# Patient Record
Sex: Male | Born: 1960 | Race: White | Hispanic: No | Marital: Single | Smoking: Current every day smoker
Health system: Southern US, Community
[De-identification: ages and names within clinical notes are randomized; demographics above are authoritative.]

## PROBLEM LIST (undated history)

## (undated) DIAGNOSIS — G709 Myoneural disorder, unspecified: Secondary | ICD-10-CM

## (undated) DIAGNOSIS — M1990 Unspecified osteoarthritis, unspecified site: Secondary | ICD-10-CM

## (undated) DIAGNOSIS — M797 Fibromyalgia: Secondary | ICD-10-CM

## (undated) HISTORY — PX: SHOULDER ARTHROSCOPY: SHX128

---

## 2014-11-20 ENCOUNTER — Other Ambulatory Visit (HOSPITAL_COMMUNITY): Payer: Self-pay | Admitting: *Deleted

## 2014-11-20 DIAGNOSIS — M542 Cervicalgia: Secondary | ICD-10-CM

## 2014-11-27 ENCOUNTER — Ambulatory Visit (HOSPITAL_COMMUNITY): Payer: Medicaid Other

## 2014-12-08 ENCOUNTER — Ambulatory Visit (HOSPITAL_COMMUNITY)
Admission: RE | Admit: 2014-12-08 | Discharge: 2014-12-08 | Disposition: A | Discharge status: Home / Self Care | Payer: Medicaid Other | Source: Ambulatory Visit | Attending: *Deleted | Admitting: *Deleted

## 2014-12-08 DIAGNOSIS — M47892 Other spondylosis, cervical region: Secondary | ICD-10-CM | POA: Insufficient documentation

## 2014-12-08 DIAGNOSIS — M2578 Osteophyte, vertebrae: Secondary | ICD-10-CM | POA: Diagnosis not present

## 2014-12-08 DIAGNOSIS — M542 Cervicalgia: Secondary | ICD-10-CM

## 2014-12-08 DIAGNOSIS — M25511 Pain in right shoulder: Secondary | ICD-10-CM | POA: Diagnosis not present

## 2014-12-08 DIAGNOSIS — M25512 Pain in left shoulder: Secondary | ICD-10-CM | POA: Insufficient documentation

## 2014-12-08 DIAGNOSIS — M4802 Spinal stenosis, cervical region: Secondary | ICD-10-CM | POA: Insufficient documentation

## 2015-03-13 ENCOUNTER — Other Ambulatory Visit: Payer: Self-pay | Admitting: Orthopaedic Surgery

## 2015-03-18 NOTE — Pre-Procedure Instructions (Signed)
   Pasco L Cerney 03/18/2015      [facility] - [place], [place] - [address] [place] [address] Phone: [phone] Fax: [phone]    Your procedure is scheduled on Tuesday March 31, 2015 at 12:30 PM.  [facility] Admitting at 10:30 A.M.  Call this number if you have problems the morning of surgery:  [phone]   Remember:  Do not eat food or drink liquids after midnight.  Take these medicines the morning of surgery with A SIP OF WATER gabapentin (Neurotin).   Do not wear jewelry, make-up or nail polish.  Do not wear lotions, powders, or perfumes.  You may wear deodorant.  Do not shave 48 hours prior to surgery.  Men may shave face and neck.  Do not bring valuables to the hospital.  [facility] not responsible [facility] any belongings or valuables.  Contacts, dentures or bridgework may not be worn into surgery.  Leave your suitcase in the car.  After surgery it may be brought to your room.  For patients admitted to the hospital, discharge time will be determined by your treatment team.  Patients discharged the day of surgery will not be allowed to drive home.    Please read over the following fact sheets that you were given. Pain Booklet, Coughing and Deep Breathing, Blood Transfusion Information, MRSA Information and Surgical Site Infection Prevention

## 2015-03-19 ENCOUNTER — Inpatient Hospital Stay (HOSPITAL_COMMUNITY)
Admission: RE | Admit: 2015-03-19 | Discharge: 2015-03-19 | Disposition: A | Discharge status: Home / Self Care | Payer: Medicaid Other | Source: Ambulatory Visit

## 2015-03-23 ENCOUNTER — Encounter (HOSPITAL_COMMUNITY): Payer: Self-pay

## 2015-03-23 ENCOUNTER — Encounter (HOSPITAL_COMMUNITY)
Admission: RE | Admit: 2015-03-23 | Discharge: 2015-03-23 | Disposition: A | Discharge status: Home / Self Care | Payer: Medicaid Other | Source: Ambulatory Visit | Attending: Orthopaedic Surgery | Admitting: Orthopaedic Surgery

## 2015-03-23 ENCOUNTER — Ambulatory Visit (HOSPITAL_COMMUNITY)
Admission: RE | Admit: 2015-03-23 | Discharge: 2015-03-23 | Disposition: A | Discharge status: Home / Self Care | Payer: Medicaid Other | Source: Ambulatory Visit | Attending: Orthopaedic Surgery | Admitting: Orthopaedic Surgery

## 2015-03-23 DIAGNOSIS — Z01818 Encounter for other preprocedural examination: Secondary | ICD-10-CM

## 2015-03-23 HISTORY — DX: Unspecified osteoarthritis, unspecified site: M19.90

## 2015-03-23 LAB — SURGICAL PCR SCREEN
MRSA, PCR: NEGATIVE
Staphylococcus aureus: NEGATIVE

## 2015-03-23 LAB — CBC WITH DIFFERENTIAL/PLATELET
BASOS ABS: 0.1 10*3/uL (ref 0.0–0.1)
BASOS PCT: 1 % (ref 0–1)
EOS PCT: 2 % (ref 0–5)
Eosinophils Absolute: 0.1 10*3/uL (ref 0.0–0.7)
HEMATOCRIT: 48.3 % (ref 39.0–52.0)
Hemoglobin: 17.4 g/dL — ABNORMAL HIGH (ref 13.0–17.0)
LYMPHS PCT: 27 % (ref 12–46)
Lymphs Abs: 1.9 10*3/uL (ref 0.7–4.0)
MCH: 32.5 pg (ref 26.0–34.0)
MCHC: 36 g/dL (ref 30.0–36.0)
MCV: 90.3 fL (ref 78.0–100.0)
Monocytes Absolute: 0.6 10*3/uL (ref 0.1–1.0)
Monocytes Relative: 9 % (ref 3–12)
NEUTROS ABS: 4.4 10*3/uL (ref 1.7–7.7)
Neutrophils Relative %: 61 % (ref 43–77)
Platelets: 198 10*3/uL (ref 150–400)
RBC: 5.35 MIL/uL (ref 4.22–5.81)
RDW: 12.2 % (ref 11.5–15.5)
WBC: 7.1 10*3/uL (ref 4.0–10.5)

## 2015-03-23 LAB — BASIC METABOLIC PANEL
ANION GAP: 9 (ref 5–15)
BUN: 10 mg/dL (ref 6–20)
CO2: 24 mmol/L (ref 22–32)
Calcium: 9.3 mg/dL (ref 8.9–10.3)
Chloride: 98 mmol/L — ABNORMAL LOW (ref 101–111)
Creatinine, Ser: 1.14 mg/dL (ref 0.61–1.24)
GFR calc Af Amer: >60 mL/min (ref >60)
GFR calc non Af Amer: >60 mL/min (ref >60)
Glucose, Bld: 109 mg/dL — ABNORMAL HIGH (ref 65–99)
POTASSIUM: 5.1 mmol/L (ref 3.5–5.1)
Sodium: 131 mmol/L — ABNORMAL LOW (ref 135–145)

## 2015-03-23 LAB — URINALYSIS, ROUTINE W REFLEX MICROSCOPIC
BILIRUBIN URINE: NEGATIVE
Glucose, UA: NEGATIVE mg/dL
Hgb urine dipstick: NEGATIVE
Ketones, ur: NEGATIVE mg/dL
Leukocytes, UA: NEGATIVE
Nitrite: NEGATIVE
Protein, ur: NEGATIVE mg/dL
Specific Gravity, Urine: 1.01 (ref 1.005–1.030)
Urobilinogen, UA: 1 mg/dL (ref 0.0–1.0)
pH: 5 (ref 5.0–8.0)

## 2015-03-23 LAB — PROTIME-INR
INR: 1.03 (ref 0.00–1.49)
PROTHROMBIN TIME: 13.7 s (ref 11.6–15.2)

## 2015-03-23 LAB — ABO/RH: ABO/RH(D): B POS

## 2015-03-23 LAB — APTT: aPTT: 28 seconds (ref 24–37)

## 2015-03-23 NOTE — Progress Notes (Signed)
Pt. Reports that he is [facility] last visit one month ago. Pt. Denies cardiac referral ever, denies cardiac problems, no complaints of chest pain or difficulty breathing.

## 2015-03-23 NOTE — Progress Notes (Signed)
   03/23/15 1534  OBSTRUCTIVE SLEEP APNEA  Have you ever been diagnosed with sleep apnea through a sleep study? No  Do you snore loudly (loud enough to be heard through closed doors)?  1  Do you often feel tired, fatigued, or sleepy during the daytime? 1  Has anyone observed you stop breathing during your sleep? 1  Do you have, or are you being treated for high blood pressure? 0  BMI more than 35 kg/m2? 0  Age over 53 years old? 1  Neck circumference greater than 40 cm/16 inches? 1  Gender: 1

## 2015-03-24 LAB — TYPE AND SCREEN
ABO/RH(D): B POS
Antibody Screen: NEGATIVE

## 2015-03-25 NOTE — H&P (Signed)
TOTAL HIP ADMISSION H&P  Patient is admitted for left total hip arthroplasty.  Subjective:  Chief Complaint: left hip pain  HPI: Bobby Reeves, 54 y.o. male, has a history of pain and functional disability in the left hip(s) due to arthritis and patient has failed non-surgical conservative treatments for greater than 12 weeks to include NSAID's and/or analgesics, flexibility and strengthening excercises, weight reduction as appropriate and activity modification.  Onset of symptoms was gradual starting 5 years ago with gradually worsening course since that time.The patient noted no past surgery on the left hip(s).  Patient currently rates pain in the left hip at 10 out of 10 with activity. Patient has night pain, worsening of pain with activity and weight bearing, trendelenberg gait, pain that interfers with activities of daily living and crepitus. Patient has evidence of subchondral cysts, subchondral sclerosis, periarticular osteophytes and joint space narrowing by imaging studies. This condition presents safety issues increasing the risk of falls. There is no current active infection.  There are no active problems to display for this patient.  Past Medical History  Diagnosis Date  . 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 , followed up with Dr. Franky Macho    Past Surgical History  Procedure Laterality Date  . Shoulder arthroscopy Bilateral     labral tear    No prescriptions prior to admission   No Known Allergies  History  Substance Use Topics  . Smoking status: Current Every Day Smoker -- 1.00 packs/day  . Smokeless tobacco: Not on file  . Alcohol Use: Yes     Comment: 12 pk. on the weekend     No family history on file.   Review of Systems  Musculoskeletal: Positive for joint pain.       Left hip  All other systems reviewed and are negative.   Objective:  Physical Exam  Constitutional: He is oriented to person, place, and time. He appears  well-developed and well-nourished.  HENT:  Head: Normocephalic and atraumatic.  Eyes: Pupils are equal, round, and reactive to light.  Neck: Normal range of motion.  Cardiovascular: Normal rate and regular rhythm.   Respiratory: Effort normal.  GI: Soft.  Musculoskeletal:  Left hip motion is extremely limited and terribly painful. His leg looks a little short on this side. He walks with a markedly altered gait. Opposite hip moves fairly well. He has some pain to palpation about his low back. Abdominal exam is benign. Sensation and motor function are intact in his feet with palpable pulses on both sides.   Neurological: He is alert and oriented to person, place, and time.  Skin: Skin is warm.  Psychiatric: He has a normal mood and affect. His behavior is normal. Judgment and thought content normal.    Vital signs in last 24 hours:    Labs:   There is no height or weight on file to calculate BMI.   Imaging Review Plain radiographs demonstrate severe degenerative joint disease of the left hip(s). The bone quality appears to be good for age and reported activity level.  Assessment/Plan:  End stage primary arthritis, left hip(s)  The patient history, physical examination, clinical judgement of the provider and imaging studies are consistent with end stage degenerative joint disease of the left hip(s) and total hip arthroplasty is deemed medically necessary. The treatment options including medical management, injection therapy, arthroscopy and arthroplasty were discussed at length. The risks and benefits of total hip arthroplasty were presented  and reviewed. The risks due to aseptic loosening, infection, stiffness, dislocation/subluxation,  thromboembolic complications and other imponderables were discussed.  The patient acknowledged the explanation, agreed to proceed with the plan and consent was signed. Patient is being admitted for inpatient treatment for surgery, pain control, PT, OT,  prophylactic antibiotics, VTE prophylaxis, progressive ambulation and ADL's and discharge planning.The patient is planning to be discharged home with home health services

## 2015-03-30 NOTE — Progress Notes (Signed)
Message left for pt. Concerning time change,notified to arrive at 7:15 AM.Pt, asked to call and let us know he received this message.

## 2015-03-31 ENCOUNTER — Inpatient Hospital Stay (HOSPITAL_COMMUNITY): Payer: Medicaid Other

## 2015-03-31 ENCOUNTER — Encounter (HOSPITAL_COMMUNITY)
Admission: RE | Disposition: A | Discharge status: Home Health | Payer: Self-pay | Source: Ambulatory Visit | Attending: Orthopaedic Surgery

## 2015-03-31 ENCOUNTER — Encounter (HOSPITAL_COMMUNITY): Payer: Self-pay | Admitting: *Deleted

## 2015-03-31 ENCOUNTER — Inpatient Hospital Stay (HOSPITAL_COMMUNITY): Payer: Medicaid Other | Admitting: Certified Registered Nurse Anesthetist

## 2015-03-31 ENCOUNTER — Inpatient Hospital Stay (HOSPITAL_COMMUNITY)
Admission: RE | Admit: 2015-03-31 | Discharge: 2015-04-02 | DRG: 470 | Disposition: A | Discharge status: Home Health | Payer: Medicaid Other | Source: Ambulatory Visit | Attending: Orthopaedic Surgery | Admitting: Orthopaedic Surgery

## 2015-03-31 DIAGNOSIS — F17200 Nicotine dependence, unspecified, uncomplicated: Secondary | ICD-10-CM | POA: Diagnosis present

## 2015-03-31 DIAGNOSIS — M797 Fibromyalgia: Secondary | ICD-10-CM | POA: Diagnosis present

## 2015-03-31 DIAGNOSIS — M1612 Unilateral primary osteoarthritis, left hip: Secondary | ICD-10-CM | POA: Diagnosis present

## 2015-03-31 DIAGNOSIS — M25552 Pain in left hip: Secondary | ICD-10-CM | POA: Diagnosis present

## 2015-03-31 DIAGNOSIS — Z419 Encounter for procedure for purposes other than remedying health state, unspecified: Secondary | ICD-10-CM

## 2015-03-31 HISTORY — DX: Fibromyalgia: M79.7

## 2015-03-31 HISTORY — PX: TOTAL HIP ARTHROPLASTY: SHX124

## 2015-03-31 HISTORY — DX: Myoneural disorder, unspecified: G70.9

## 2015-03-31 SURGERY — ARTHROPLASTY, HIP, TOTAL, ANTERIOR APPROACH
Anesthesia: Choice | Site: Hip | Laterality: Left

## 2015-03-31 MED ORDER — PHENOL 1.4 % MT LIQD: 1.0000 | OROMUCOSAL | Status: DC | PRN

## 2015-03-31 MED ORDER — MIDAZOLAM HCL 2 MG/2ML IJ SOLN
INTRAMUSCULAR | Status: AC
  Filled 2015-03-31: qty 2

## 2015-03-31 MED ORDER — ROCURONIUM BROMIDE 50 MG/5ML IV SOLN
INTRAVENOUS | Status: AC
  Filled 2015-03-31: qty 1

## 2015-03-31 MED ORDER — DIPHENHYDRAMINE HCL 12.5 MG/5ML PO ELIX
12.5000 mg | ORAL_SOLUTION | ORAL | Status: DC | PRN
Start: 2015-03-31 — End: 2015-04-02

## 2015-03-31 MED ORDER — EPHEDRINE SULFATE 50 MG/ML IJ SOLN
INTRAMUSCULAR | Status: AC
  Filled 2015-03-31: qty 3

## 2015-03-31 MED ORDER — FENTANYL CITRATE (PF) 250 MCG/5ML IJ SOLN
INTRAMUSCULAR | Status: DC | PRN
  Administered 2015-03-31 (×5): 50 ug via INTRAVENOUS

## 2015-03-31 MED ORDER — ALUM & MAG HYDROXIDE-SIMETH 200-200-20 MG/5ML PO SUSP: 30.0000 mL | ORAL | Status: DC | PRN

## 2015-03-31 MED ORDER — DEXTROSE 5 % IV SOLN
10.0000 mg | INTRAVENOUS | Status: DC | PRN
  Administered 2015-03-31: 30 ug/min via INTRAVENOUS

## 2015-03-31 MED ORDER — FENTANYL CITRATE (PF) 250 MCG/5ML IJ SOLN
INTRAMUSCULAR | Status: AC
  Filled 2015-03-31: qty 5

## 2015-03-31 MED ORDER — 0.9 % SODIUM CHLORIDE (POUR BTL) OPTIME
TOPICAL | Status: DC | PRN
  Administered 2015-03-31: 1000 mL

## 2015-03-31 MED ORDER — PROPOFOL 10 MG/ML IV BOLUS
INTRAVENOUS | Status: DC | PRN
  Administered 2015-03-31: 40 mg via INTRAVENOUS

## 2015-03-31 MED ORDER — PHENYLEPHRINE HCL 10 MG/ML IJ SOLN
INTRAMUSCULAR | Status: DC | PRN
  Administered 2015-03-31: 120 ug via INTRAVENOUS
  Administered 2015-03-31: 80 ug via INTRAVENOUS

## 2015-03-31 MED ORDER — GABAPENTIN 300 MG PO CAPS
300.0000 mg | ORAL_CAPSULE | Freq: Three times a day (TID) | ORAL | Status: DC | PRN
Start: 2015-03-31 — End: 2015-04-02

## 2015-03-31 MED ORDER — OXYCODONE HCL 5 MG PO TABS
ORAL_TABLET | ORAL | Status: AC
  Filled 2015-03-31: qty 2

## 2015-03-31 MED ORDER — NICOTINE 7 MG/24HR TD PT24
7.0000 mg | MEDICATED_PATCH | Freq: Every day | TRANSDERMAL | Status: DC
  Administered 2015-03-31 – 2015-04-02 (×3): 7 mg via TRANSDERMAL
  Filled 2015-03-31 (×3): qty 1

## 2015-03-31 MED ORDER — LACTATED RINGERS IV SOLN
INTRAVENOUS | Status: DC
  Administered 2015-03-31 (×2): via INTRAVENOUS

## 2015-03-31 MED ORDER — DEXMEDETOMIDINE HCL 200 MCG/2ML IV SOLN
INTRAVENOUS | Status: DC | PRN
  Administered 2015-03-31 (×3): 8 ug via INTRAVENOUS

## 2015-03-31 MED ORDER — BUPIVACAINE LIPOSOME 1.3 % IJ SUSP
20.0000 mL | INTRAMUSCULAR | Status: DC
  Filled 2015-03-31: qty 20

## 2015-03-31 MED ORDER — HYDROMORPHONE HCL 1 MG/ML IJ SOLN
0.5000 mg | INTRAMUSCULAR | Status: DC | PRN
  Administered 2015-03-31 – 2015-04-01 (×4): 1 mg via INTRAVENOUS
  Filled 2015-03-31 (×4): qty 1

## 2015-03-31 MED ORDER — PHENYLEPHRINE 40 MCG/ML (10ML) SYRINGE FOR IV PUSH (FOR BLOOD PRESSURE SUPPORT)
PREFILLED_SYRINGE | INTRAVENOUS | Status: AC
  Filled 2015-03-31: qty 40

## 2015-03-31 MED ORDER — HYDROMORPHONE HCL 1 MG/ML IJ SOLN
INTRAMUSCULAR | Status: AC
  Filled 2015-03-31: qty 1

## 2015-03-31 MED ORDER — ACETAMINOPHEN 325 MG PO TABS
650.0000 mg | ORAL_TABLET | Freq: Four times a day (QID) | ORAL | Status: DC | PRN
  Administered 2015-04-01: 650 mg via ORAL
  Filled 2015-03-31: qty 2

## 2015-03-31 MED ORDER — SUCCINYLCHOLINE CHLORIDE 20 MG/ML IJ SOLN
INTRAMUSCULAR | Status: AC
  Filled 2015-03-31: qty 3

## 2015-03-31 MED ORDER — METHOCARBAMOL 1000 MG/10ML IJ SOLN
500.0000 mg | Freq: Four times a day (QID) | INTRAMUSCULAR | Status: DC | PRN
  Filled 2015-03-31: qty 5

## 2015-03-31 MED ORDER — ONDANSETRON HCL 4 MG PO TABS: 4.0000 mg | ORAL_TABLET | Freq: Four times a day (QID) | ORAL | Status: DC | PRN

## 2015-03-31 MED ORDER — GLYCOPYRROLATE 0.2 MG/ML IJ SOLN
INTRAMUSCULAR | Status: DC | PRN
  Administered 2015-03-31: .2 mg via INTRAVENOUS

## 2015-03-31 MED ORDER — TRANEXAMIC ACID 1000 MG/10ML IV SOLN
1000.0000 mg | INTRAVENOUS | Status: AC
  Administered 2015-03-31: 1000 mg via INTRAVENOUS
  Filled 2015-03-31 (×2): qty 10

## 2015-03-31 MED ORDER — ONDANSETRON HCL 4 MG/2ML IJ SOLN: 4.0000 mg | Freq: Four times a day (QID) | INTRAMUSCULAR | Status: DC | PRN

## 2015-03-31 MED ORDER — BUPIVACAINE IN DEXTROSE 0.75-8.25 % IT SOLN
INTRATHECAL | Status: DC | PRN
  Administered 2015-03-31: 1.8 mL via INTRATHECAL

## 2015-03-31 MED ORDER — METOCLOPRAMIDE HCL 5 MG PO TABS
5.0000 mg | ORAL_TABLET | Freq: Three times a day (TID) | ORAL | Status: DC | PRN
Start: 2015-03-31 — End: 2015-04-02

## 2015-03-31 MED ORDER — LIDOCAINE HCL (CARDIAC) 20 MG/ML IV SOLN
INTRAVENOUS | Status: AC
  Filled 2015-03-31: qty 5

## 2015-03-31 MED ORDER — OXYCODONE HCL 5 MG PO TABS
5.0000 mg | ORAL_TABLET | ORAL | Status: DC | PRN
  Administered 2015-03-31 – 2015-04-02 (×12): 10 mg via ORAL
  Filled 2015-03-31 (×11): qty 2

## 2015-03-31 MED ORDER — CEFAZOLIN SODIUM-DEXTROSE 2-3 GM-% IV SOLR: 2.0000 g | INTRAVENOUS | Status: DC

## 2015-03-31 MED ORDER — CEFAZOLIN SODIUM-DEXTROSE 2-3 GM-% IV SOLR
2.0000 g | Freq: Four times a day (QID) | INTRAVENOUS | Status: AC
  Administered 2015-03-31 – 2015-04-01 (×2): 2 g via INTRAVENOUS
  Filled 2015-03-31 (×3): qty 50

## 2015-03-31 MED ORDER — MEPERIDINE HCL 25 MG/ML IJ SOLN: 6.2500 mg | INTRAMUSCULAR | Status: DC | PRN

## 2015-03-31 MED ORDER — MIDAZOLAM HCL 5 MG/5ML IJ SOLN
INTRAMUSCULAR | Status: DC | PRN
  Administered 2015-03-31: 2 mg via INTRAVENOUS

## 2015-03-31 MED ORDER — CHLORHEXIDINE GLUCONATE 4 % EX LIQD: 60.0000 mL | Freq: Once | CUTANEOUS | Status: DC

## 2015-03-31 MED ORDER — HYDROMORPHONE HCL 1 MG/ML IJ SOLN
0.2500 mg | INTRAMUSCULAR | Status: DC | PRN
  Administered 2015-03-31 (×2): 0.5 mg via INTRAVENOUS

## 2015-03-31 MED ORDER — PROMETHAZINE HCL 25 MG/ML IJ SOLN
6.2500 mg | INTRAMUSCULAR | Status: DC | PRN
Start: 2015-03-31 — End: 2015-03-31

## 2015-03-31 MED ORDER — MENTHOL 3 MG MT LOZG: 1.0000 | LOZENGE | OROMUCOSAL | Status: DC | PRN

## 2015-03-31 MED ORDER — METHOCARBAMOL 500 MG PO TABS
ORAL_TABLET | ORAL | Status: AC
  Filled 2015-03-31: qty 1

## 2015-03-31 MED ORDER — PROPOFOL 10 MG/ML IV BOLUS
INTRAVENOUS | Status: AC
  Filled 2015-03-31: qty 20

## 2015-03-31 MED ORDER — LIDOCAINE HCL (CARDIAC) 20 MG/ML IV SOLN
INTRAVENOUS | Status: AC
  Filled 2015-03-31: qty 10

## 2015-03-31 MED ORDER — ONDANSETRON HCL 4 MG/2ML IJ SOLN
INTRAMUSCULAR | Status: AC
  Filled 2015-03-31: qty 4

## 2015-03-31 MED ORDER — METOCLOPRAMIDE HCL 5 MG/ML IJ SOLN: 5.0000 mg | Freq: Three times a day (TID) | INTRAMUSCULAR | Status: DC | PRN

## 2015-03-31 MED ORDER — ONDANSETRON HCL 4 MG/2ML IJ SOLN
INTRAMUSCULAR | Status: AC
  Filled 2015-03-31: qty 2

## 2015-03-31 MED ORDER — LACTATED RINGERS IV SOLN
INTRAVENOUS | Status: DC
  Administered 2015-03-31: 18:00:00 via INTRAVENOUS

## 2015-03-31 MED ORDER — ASPIRIN EC 325 MG PO TBEC
325.0000 mg | DELAYED_RELEASE_TABLET | Freq: Two times a day (BID) | ORAL | Status: DC
  Administered 2015-04-01 – 2015-04-02 (×3): 325 mg via ORAL
  Filled 2015-03-31 (×3): qty 1

## 2015-03-31 MED ORDER — CEFAZOLIN SODIUM-DEXTROSE 2-3 GM-% IV SOLR
INTRAVENOUS | Status: AC
  Administered 2015-03-31: 2 g via INTRAVENOUS
  Filled 2015-03-31: qty 50

## 2015-03-31 MED ORDER — PROPOFOL INFUSION 10 MG/ML OPTIME
INTRAVENOUS | Status: DC | PRN
  Administered 2015-03-31: 100 ug/kg/min via INTRAVENOUS

## 2015-03-31 MED ORDER — LIDOCAINE HCL (CARDIAC) 20 MG/ML IV SOLN
INTRAVENOUS | Status: DC | PRN
  Administered 2015-03-31: 20 mg via INTRAVENOUS

## 2015-03-31 MED ORDER — METHOCARBAMOL 500 MG PO TABS
500.0000 mg | ORAL_TABLET | Freq: Four times a day (QID) | ORAL | Status: DC | PRN
  Administered 2015-03-31 – 2015-04-02 (×8): 500 mg via ORAL
  Filled 2015-03-31 (×8): qty 1

## 2015-03-31 MED ORDER — ACETAMINOPHEN 650 MG RE SUPP: 650.0000 mg | Freq: Four times a day (QID) | RECTAL | Status: DC | PRN

## 2015-03-31 MED ORDER — DOCUSATE SODIUM 100 MG PO CAPS
100.0000 mg | ORAL_CAPSULE | Freq: Two times a day (BID) | ORAL | Status: DC
  Administered 2015-03-31 – 2015-04-02 (×4): 100 mg via ORAL
  Filled 2015-03-31 (×4): qty 1

## 2015-03-31 MED ORDER — BISACODYL 5 MG PO TBEC: 5.0000 mg | DELAYED_RELEASE_TABLET | Freq: Every day | ORAL | Status: DC | PRN

## 2015-03-31 MED ORDER — STERILE WATER FOR INJECTION IJ SOLN
INTRAMUSCULAR | Status: AC
  Filled 2015-03-31: qty 20

## 2015-03-31 MED ORDER — EPHEDRINE SULFATE 50 MG/ML IJ SOLN
INTRAMUSCULAR | Status: DC | PRN
  Administered 2015-03-31: 10 mg via INTRAVENOUS

## 2015-03-31 SURGICAL SUPPLY — 47 items
BLADE SAW SGTL 18X1.27X75 (BLADE) ×2 IMPLANT
BLADE SURG ROTATE 9660 (MISCELLANEOUS) IMPLANT
CAPT HIP TOTAL 2 ×2 IMPLANT
CELLS DAT CNTRL 66122 CELL SVR (MISCELLANEOUS) ×1 IMPLANT
COVER PERINEAL POST (MISCELLANEOUS) ×2 IMPLANT
COVER SURGICAL LIGHT HANDLE (MISCELLANEOUS) ×2 IMPLANT
DRAPE C-ARM 42X72 X-RAY (DRAPES) ×2 IMPLANT
DRAPE IMP U-DRAPE 54X76 (DRAPES) ×2 IMPLANT
DRAPE STERI IOBAN 125X83 (DRAPES) ×2 IMPLANT
DRAPE U-SHAPE 47X51 STRL (DRAPES) ×6 IMPLANT
DRSG AQUACEL AG ADV 3.5X10 (GAUZE/BANDAGES/DRESSINGS) ×2 IMPLANT
DURAPREP 26ML APPLICATOR (WOUND CARE) ×2 IMPLANT
ELECT BLADE 4.0 EZ CLEAN MEGAD (MISCELLANEOUS) ×2
ELECT CAUTERY BLADE 6.4 (BLADE) ×2 IMPLANT
ELECT REM PT RETURN 9FT ADLT (ELECTROSURGICAL) ×2
ELECTRODE BLDE 4.0 EZ CLN MEGD (MISCELLANEOUS) ×1 IMPLANT
ELECTRODE REM PT RTRN 9FT ADLT (ELECTROSURGICAL) ×1 IMPLANT
FACESHIELD WRAPAROUND (MASK) ×4 IMPLANT
GLOVE BIO SURGEON STRL SZ8 (GLOVE) ×10 IMPLANT
GLOVE BIOGEL PI IND STRL 8 (GLOVE) ×2 IMPLANT
GLOVE BIOGEL PI INDICATOR 8 (GLOVE) ×2
GOWN STRL REUS W/ TWL LRG LVL3 (GOWN DISPOSABLE) ×1 IMPLANT
GOWN STRL REUS W/ TWL XL LVL3 (GOWN DISPOSABLE) ×2 IMPLANT
GOWN STRL REUS W/TWL LRG LVL3 (GOWN DISPOSABLE) ×1
GOWN STRL REUS W/TWL XL LVL3 (GOWN DISPOSABLE) ×2
KIT BASIN OR (CUSTOM PROCEDURE TRAY) ×2 IMPLANT
KIT ROOM TURNOVER OR (KITS) ×2 IMPLANT
LINER BOOT UNIVERSAL DISP (MISCELLANEOUS) ×2 IMPLANT
MANIFOLD NEPTUNE II (INSTRUMENTS) ×2 IMPLANT
NS IRRIG 1000ML POUR BTL (IV SOLUTION) ×2 IMPLANT
PACK TOTAL JOINT (CUSTOM PROCEDURE TRAY) ×2 IMPLANT
PACK UNIVERSAL I (CUSTOM PROCEDURE TRAY) ×2 IMPLANT
PAD ARMBOARD 7.5X6 YLW CONV (MISCELLANEOUS) ×4 IMPLANT
RTRCTR WOUND ALEXIS 18CM MED (MISCELLANEOUS) ×2
STAPLER VISISTAT 35W (STAPLE) ×2 IMPLANT
SUT ETHIBOND NAB CT1 #1 30IN (SUTURE) ×6 IMPLANT
SUT VIC AB 0 CT1 27 (SUTURE) ×1
SUT VIC AB 0 CT1 27XBRD ANBCTR (SUTURE) ×1 IMPLANT
SUT VIC AB 1 CT1 27 (SUTURE) ×1
SUT VIC AB 1 CT1 27XBRD ANBCTR (SUTURE) ×1 IMPLANT
SUT VIC AB 2-0 CT1 27 (SUTURE) ×1
SUT VIC AB 2-0 CT1 TAPERPNT 27 (SUTURE) ×1 IMPLANT
SUT VLOC 180 0 24IN GS25 (SUTURE) ×2 IMPLANT
TOWEL OR 17X24 6PK STRL BLUE (TOWEL DISPOSABLE) ×2 IMPLANT
TOWEL OR 17X26 10 PK STRL BLUE (TOWEL DISPOSABLE) ×4 IMPLANT
TRAY FOLEY CATH 14FR (SET/KITS/TRAYS/PACK) IMPLANT
WATER STERILE IRR 1000ML POUR (IV SOLUTION) ×4 IMPLANT

## 2015-03-31 NOTE — Anesthesia Preprocedure Evaluation (Addendum)
Anesthesia Evaluation  Patient identified by MRN, date of birth, ID band Patient awake    Reviewed: Allergy & Precautions, NPO status , Patient's Chart, lab work & pertinent test results  Airway Mallampati: I  TM Distance: >3 FB Neck ROM: Full    Dental   Pulmonary neg pulmonary ROS, Current Smoker,    Pulmonary exam normal       Cardiovascular negative cardio ROS Normal cardiovascular exam    Neuro/Psych negative neurological ROS  negative psych ROS   GI/Hepatic negative GI ROS, Neg liver ROS,   Endo/Other  negative endocrine ROS  Renal/GU negative Renal ROS     Musculoskeletal negative musculoskeletal ROS (+) Arthritis -, Fibromyalgia -  Abdominal   Peds  Hematology negative hematology ROS (+)   Anesthesia Other Findings   Reproductive/Obstetrics negative OB ROS                            Anesthesia Physical Anesthesia Plan  ASA: II  Anesthesia Plan: Spinal   Post-op Pain Management:    Induction: Intravenous  Airway Management Planned: Natural Airway  Additional Equipment:   Intra-op Plan:   Post-operative Plan:   Informed Consent: I have reviewed the patients History and Physical, chart, labs and discussed the procedure including the risks, benefits and alternatives for the proposed anesthesia with the patient or authorized representative who has indicated his/her understanding and acceptance.     Plan Discussed with: CRNA and Surgeon  Anesthesia Plan Comments:         Anesthesia Quick Evaluation

## 2015-03-31 NOTE — Interval H&P Note (Signed)
OK for surgery PD 

## 2015-03-31 NOTE — Op Note (Signed)
PRE-OP DIAGNOSIS:  LEFT HIP DEGENERATIVE JOINT DISEASE POST-OP DIAGNOSIS: same PROCEDURE:  LEFT TOTAL HIP ARTHROPLASTY ANTERIOR APPROACH ANESTHESIA:  Spinal and MAC SURGEON:  Marcene Corning MD ASSISTANT:  Elodia Florence PA-C   INDICATIONS FOR PROCEDURE:  The patient is a 54 y.o. male with a long history of a painful hip.  This has persisted despite multiple conservative measures.  The patient has persisted with pain and dysfunction making rest and activity difficult.  A total hip replacement is offered as surgical treatment.  Informed operative consent was obtained after discussion of possible complications including reaction to anesthesia, infection, neurovascular injury, dislocation, DVT, PE, and death.  The importance of the postoperative rehab program to optimize result was stressed with the patient.  SUMMARY OF FINDINGS AND PROCEDURE:  Under general anesthesia through a anterior approach an the Hana table a left THR was performed.  The patient had severe degenerative change and excellent bone quality.  We used DePuy components to replace the hip and these were size KA12 Corail femur capped with a +1.5 79mm ceramic hip ball.  On the acetabular side we used a size 54 Gription shell with a plus 4 neutral polyethylene liner.  We did use a hole eliminator.  Elodia Florence PA-C assisted throughout and was invaluable to the completion of the case in that he helped position and retract while I performed the procedure.  He also closed simultaneously to help minimize OR time.  I used fluoroscopy throughout the case to check position of components and leg lengths and read all these views myself.  DESCRIPTION OF PROCEDURE:  The patient was taken to the OR suite where general anesthetic was applied.  The patient was then positioned on the Hana table supine.  All bony prominences were appropriately padded.  Prep and drape was then performed in normal sterile fashion.  The patient was given kefzol preoperative  antibiotic and an appropriate time out was performed.  We then took an anterior approach to the left hip.  Dissection was taken through adipose to the tensor fascia lata fascia.  This structure was incised longitudinally and we dissected in the intermuscular interval just medial to this muscle.  Cobra retractors were placed superior and inferior to the femoral neck superficial to the capsule.  A capsular incision was then made and the retractors were placed along the femoral neck.  Xray was brought in to get a good level for the femoral neck cut which was made with an oscillating saw and osteotome.  The femoral head was removed with a corkscrew.  The acetabulum was exposed and some labral tissues were excised. Reaming was taken to the inside wall of the pelvis and sequentially up to 1 mm smaller than the actual component.  A trial of components was done and then the aforementioned acetabular shell was placed in appropriate tilt and anteversion confirmed by fluoroscopy. The liner was placed along with the hole eliminator and attention was turned to the femur.  The leg was brought down and over into adduction and the elevator bar was used to raise the femur up gently in the wound.  The piriformis was released with care taken to preserve the obturator internus attachment and all of the posterior capsule. The femur was reamed and then broached to the appropriate size.  A trial reduction was done and the aforementioned head and neck assembly gave [place] the best stability in extension with external rotation.  Leg lengths were felt to be about equal by fluoroscopic exam.  The trial components were removed and the wound irrigated.  We then placed the femoral component in appropriate anteversion.  The head was applied to a dry stem neck and the hip again reduced.  It was again stable in the aforementioned position.  The would was irrigated again followed by re-approximation of anterior capsule with ethibond suture. Tensor  fascia was repaired with V-loc suture  followed by subcutaneous closure with #O and #2 undyed vicryl.  Skin was closed with staples followed by a sterile dressing.  EBL and IOF can be obtained from anesthesia records.  DISPOSITION:  The patient was extubated in the OR and taken to PACU in stable condition to be admitted to the Orthopedic Surgery for appropriate post-op care to include perioperative antibiotics and DVT prophylaxis.

## 2015-03-31 NOTE — Progress Notes (Signed)
Lunch relief by M. Taylor  RN 

## 2015-03-31 NOTE — Plan of Care (Signed)
Problem: Consults Goal: Diagnosis- Total Joint Replacement Primary Total Hip Left     

## 2015-03-31 NOTE — Transfer of Care (Signed)
Immediate Anesthesia Transfer of Care Note  Patient: Bobby Reeves  Procedure(s) Performed: Procedure(s): TOTAL HIP ARTHROPLASTY ANTERIOR APPROACH (Left)  Patient Location: PACU  Anesthesia Type:Spinal  Level of Consciousness: awake, alert  and oriented  Airway & Oxygen Therapy: Patient Spontanous Breathing and Patient connected to nasal cannula oxygen  Post-op Assessment: Report given to RN and Post -op Vital signs reviewed and stable  Post vital signs: Reviewed and stable  Last Vitals:  Filed Vitals:   03/31/15 1038  BP: 161/110  Pulse: 91  Temp: 36.7 C  Resp: 20    Complications: No apparent anesthesia complications

## 2015-03-31 NOTE — Anesthesia Procedure Notes (Signed)
Spinal Patient location during procedure: OR Start time: 03/31/2015 11:10 AM End time: 03/31/2015 11:20 AM Staffing Anesthesiologist: Arta Bruce Performed by: anesthesiologist  Preanesthetic Checklist Completed: patient identified, site marked, surgical consent, pre-op evaluation, timeout performed, IV checked, risks and benefits discussed and monitors and equipment checked Spinal Block Patient position: sitting Prep: Betadine Patient monitoring: heart rate, cardiac monitor, continuous pulse ox and blood pressure Approach: right paramedian Location: L3-4 Injection technique: single-shot Needle Needle type: Pencan  Needle gauge: 24 G Needle length: 9 cm Needle insertion depth: 9 cm Assessment Sensory level: T8

## 2015-03-31 NOTE — Anesthesia Postprocedure Evaluation (Signed)
Anesthesia Post Note  Patient: Bobby Reeves  Procedure(s) Performed: Procedure(s) (LRB): TOTAL HIP ARTHROPLASTY ANTERIOR APPROACH (Left)  Anesthesia type: spinal  Patient location: PACU  Post pain: Pain level controlled  Post assessment: Patient's Cardiovascular Status Stable  Last Vitals:  Filed Vitals:   03/31/15 1500  BP:   Pulse: 81  Temp:   Resp: 13    Post vital signs: Reviewed and stable  Level of consciousness: awake  Complications: No apparent anesthesia complications

## 2015-04-01 ENCOUNTER — Encounter (HOSPITAL_COMMUNITY): Payer: Self-pay | Admitting: General Practice

## 2015-04-01 NOTE — Evaluation (Signed)
Occupational Therapy Evaluation Patient Details Name: Bobby Reeves MRN: [id number] DOB: 1960/12/19 Today's Date: 04/01/2015    History of Present Illness pt presents with L direct THA.  pt with hx of Bil shoulder surgeries.     Clinical Impression   Pt s/p above. Pt independent with ADLs, PTA. Feel pt will benefit from acute OT to increase independence prior to d/c. Plan to practice tub transfer next session.    Follow Up Recommendations  No OT follow up;Supervision - Intermittent    Equipment Recommendations  3 in 1 bedside comode    Recommendations for Other Services       Precautions / Restrictions Precautions Precautions: Fall Restrictions Weight Bearing Restrictions: Yes LLE Weight Bearing: Weight bearing as tolerated      Mobility Bed Mobility               General bed mobility comments: not assessed  Transfers Overall transfer level: Needs assistance   Transfers: Sit to/from Stand Sit to Stand: Supervision              Balance    No LOB in session. Balance not formally assessed.                                        ADL Overall ADL's : Needs assistance/impaired     Grooming: Set up;Sitting               Lower Body Dressing: Moderate assistance;Sit to/from stand   Toilet Transfer: Supervision/safety;Ambulation;RW (chair)           Functional mobility during ADLs: Supervision/safety;Rolling walker General ADL Comments: Educated on safety such as safe footwear, use of bag on walker, rugs/items on floor, sitting for LB ADLs. Educated on tub transfer techniques.  Explained that moving is beneficial. Educated on LB dressing technique.     Vision     Perception     Praxis      Pertinent Vitals/Pain Pain Assessment: 0-10 Pain Score: 5  Pain Location: LLE Pain Descriptors / Indicators: Sore Pain Intervention(s): Repositioned;Monitored during session   BP 153/106 sitting in chair     Hand Dominance      Extremity/Trunk Assessment Upper Extremity Assessment Upper Extremity Assessment: Overall WFL for tasks assessed   Lower Extremity Assessment Lower Extremity Assessment: Defer to PT evaluation       Communication Communication Communication: No difficulties   Cognition Arousal/Alertness: Awake/alert Behavior During Therapy: WFL for tasks assessed/performed Overall Cognitive Status: Within Functional Limits for tasks assessed                     General Comments       Exercises       Shoulder Instructions      Home Living Family/patient expects to be discharged to:: Private residence Living Arrangements: Spouse/significant other Available Help at Discharge: Family;Available 24 hours/day Type of Home: House Home Access: Stairs to enter [employer] of Steps: 1 (and threshold) Entrance Stairs-Rails: None Home Layout: One level     Bathroom Shower/Tub: Chief Strategy Officer: Standard     Home Equipment: Environmental consultant - 2 wheels   Additional Comments: Wife is disabled, but his brothers and sisters will be providing care for pt.        Prior Functioning/Environment Level of Independence: Independent with assistive device(s)        Comments: pt  had been using RW lately due to pain.      OT Diagnosis: Acute pain   OT Problem List: Decreased knowledge of use of DME or AE;Pain;Decreased activity tolerance;Decreased range of motion   OT Treatment/Interventions: Self-care/ADL training;DME and/or AE instruction;Therapeutic activities;Patient/family education;Balance training    OT Goals(Current goals can be found in the care plan section) Acute Rehab OT Goals Patient Stated Goal: not stated OT Goal Formulation: With patient Time For Goal Achievement: 04/08/15 Potential to Achieve Goals: Good ADL Goals Pt Will Perform Lower Body Dressing: with set-up;with supervision;sit to/from stand Pt Will Transfer to Toilet: with modified  independence;ambulating (3 in 1 over commode) Pt Will Perform Tub/Shower Transfer: Tub transfer;with supervision;ambulating;3 in 1;rolling walker  OT Frequency: Min 2X/week   Barriers to D/C:            Co-evaluation              End of Session Equipment Utilized During Treatment: Gait belt;Rolling walker  Activity Tolerance: Patient tolerated treatment well Patient left: in chair;with call bell/phone within reach   Time: 1212-1227 OT Time Calculation (min): 15 min Charges:  OT General Charges $OT Visit: 1 Procedure OT Evaluation $Initial OT Evaluation Tier I: 1 Procedure G-CodesEarlie Raveling OTR/L [id number] 04/01/2015, 1:59 PM

## 2015-04-01 NOTE — Progress Notes (Signed)
Subjective: 1 Day Post-Op Procedure(s) (LRB): TOTAL HIP ARTHROPLASTY ANTERIOR APPROACH (Left)  Activity level:  ok Diet tolerance:  ok Voiding:  ok Patient reports pain as mild and moderate.    Objective: Vital signs in last 24 hours: Temp:  [97.4 F (36.3 C)-99.1 F (37.3 C)] 98.9 F (37.2 C) (06/15 0534) Pulse Rate:  [80-97] 93 (06/15 0534) Resp:  [11-20] 16 (06/15 0534) BP: (102-161)/(60-110) 159/96 mmHg (06/15 0534) SpO2:  [92 %-100 %] 95 % (06/15 0534)  Labs: No results for input(s): HGB in the last 72 hours. No results for input(s): WBC, RBC, HCT, PLT in the last 72 hours. No results for input(s): NA, K, CL, CO2, BUN, CREATININE, GLUCOSE, CALCIUM in the last 72 hours. No results for input(s): LABPT, INR in the last 72 hours.  Physical Exam:  Neurologically intact ABD soft Neurovascular intact Sensation intact distally Intact pulses distally Dorsiflexion/Plantar flexion intact Incision: dressing C/D/I and no drainage No cellulitis present Compartment soft  Assessment/Plan:  1 Day Post-Op Procedure(s) (LRB): TOTAL HIP ARTHROPLASTY ANTERIOR APPROACH (Left) Advance diet Up with therapy D/C IV fluids Plan for discharge tomorrow Discharge home with home health if doing well and cleared by PT. Continue on ASA 325mg  BID x 4 weeks post op. Follow up in office 2 weeks post op.    Breasia Karges, Ginger Organ 04/01/2015, 7:34 AM

## 2015-04-01 NOTE — Evaluation (Signed)
Physical Therapy Evaluation Patient Details Name: Bobby Reeves MRN: [id number] DOB: Sep 12, 1961 Today's Date: 04/01/2015   History of Present Illness  pt presents with L THA.  pt with hx of Bil shoulder surgeries.    Clinical Impression  Pt very motivated and anticipate good progress.  Will continue to follow while on acute.      Follow Up Recommendations Home health PT;Supervision for mobility/OOB    Equipment Recommendations  3in1 (PT)    Recommendations for Other Services       Precautions / Restrictions Precautions Precautions: None Restrictions Weight Bearing Restrictions: Yes LLE Weight Bearing: Weight bearing as tolerated      Mobility  Bed Mobility Overal bed mobility: Needs Assistance Bed Mobility: Supine to Sit     Supine to sit: Min assist     General bed mobility comments: A with L LE only.  pt does utilize bed rails.    Transfers Overall transfer level: Needs assistance Equipment used: Rolling walker (2 wheeled) Transfers: Sit to/from Stand Sit to Stand: Min guard         General transfer comment: cues for UE use and controlling descent to sitting.    Ambulation/Gait Ambulation/Gait assistance: Min guard Ambulation Distance (Feet): 100 Feet Assistive device: Rolling walker (2 wheeled) Gait Pattern/deviations: Step-through pattern;Decreased stride length     General Gait Details: cues for positioning within RW, upright posture, and gait sequencing.    Stairs            Wheelchair Mobility    Modified Rankin (Stroke Patients Only)       Balance Overall balance assessment: No apparent balance deficits (not formally assessed)                                           Pertinent Vitals/Pain Pain Assessment: 0-10 Pain Score: 5  Pain Location: L hip Pain Descriptors / Indicators: Tightness;Sore Pain Intervention(s): Monitored during session;Premedicated before session;Repositioned    Home Living  Family/patient expects to be discharged to:: Private residence Living Arrangements: Spouse/significant other Available Help at Discharge: Family;Available 24 hours/day Type of Home: House Home Access: Stairs to enter Entrance Stairs-Rails: None Entrance Stairs-Number of Steps: 1 (and threshold) Home Layout: One level Home Equipment: Walker - 2 wheels Additional Comments: Wife is disabled, but his brothers and sisters will be providing care for pt.      Prior Function Level of Independence: Independent with assistive device(s)         Comments: pt had been using RW lately due to pain.       Hand Dominance        Extremity/Trunk Assessment   Upper Extremity Assessment: Defer to OT evaluation           Lower Extremity Assessment: LLE deficits/detail   LLE Deficits / Details: Generally weak post-op.  Sensation intact  Cervical / Trunk Assessment: Normal  Communication   Communication: No difficulties  Cognition Arousal/Alertness: Awake/alert Behavior During Therapy: WFL for tasks assessed/performed Overall Cognitive Status: Within Functional Limits for tasks assessed                      General Comments      Exercises Total Joint Exercises Ankle Circles/Pumps: AROM;Both;10 reps Quad Sets: AROM;Both;10 reps Long Arc Quad: AROM;Left;10 reps      Assessment/Plan    PT Assessment Patient needs continued PT services  PT Diagnosis Difficulty walking   PT Problem List Decreased strength;Decreased activity tolerance;Decreased balance;Decreased mobility;Decreased coordination;Decreased knowledge of use of DME;Pain  PT Treatment Interventions DME instruction;Gait training;Stair training;Functional mobility training;Therapeutic activities;Therapeutic exercise;Balance training;Patient/family education   PT Goals (Current goals can be found in the Care Plan section) Acute Rehab PT Goals Patient Stated Goal: Walk without pain. PT Goal Formulation: With  patient Time For Goal Achievement: 04/08/15 Potential to Achieve Goals: Good    Frequency 7X/week   Barriers to discharge        Co-evaluation               End of Session Equipment Utilized During Treatment: Gait belt Activity Tolerance: Patient tolerated treatment well Patient left: in chair;with call bell/phone within reach Nurse Communication: Mobility status         Time: 2130-8657 PT Time Calculation (min) (ACUTE ONLY): 34 min   Charges:   PT Evaluation $Initial PT Evaluation Tier I: 1 Procedure PT Treatments $Gait Training: 8-22 mins   PT G CodesSunny Schlein, [place] [phone] 04/01/2015, 9:26 AM

## 2015-04-01 NOTE — Care Management Note (Signed)
Case Management Note  Patient Details  Name: Bobby Reeves MRN: [id number] Date of Birth: [birth year]  Subjective/Objective:                 S/p left total hip arhthroplasty   Action/Plan:   Set up with [facility] for HHPT by MD office. Spoke with patient, no change in discharge plan. Patient states that he will have family available to assist after discharge and that he has a rolling walker at home. Contacted James at [facility] and requested 3N1 be delivered to patient's room.   Expected Discharge Date:                  Expected Discharge Plan:  Home w Home Health Services  In-House Referral:  NA  Discharge planning Services  CM Consult  Post Acute Care Choice:  Durable Medical Equipment, Home Health Choice offered to:  Patient  DME Arranged:  3-N-1 DME Agency:  [facility]  HH Arranged:  PT HH Agency:  [facility]  Status of Service:  Completed, signed off  Medicare Important Message Given:    Date Medicare IM Given:    Medicare IM give by:    Date Additional Medicare IM Given:    Additional Medicare Important Message give by:     If discussed at Long Length of Stay Meetings, dates discussed:    Additional Comments:  Monica Becton, RN 04/01/2015, 12:01 PM

## 2015-04-01 NOTE — Progress Notes (Signed)
Physical Therapy Treatment Patient Details Name: Bobby Reeves MRN: [id number] DOB: 1961-10-13 Today's Date: 04/01/2015    History of Present Illness pt presents with L direct THA.  pt with hx of Bil shoulder surgeries.      PT Comments    Pt indicates a little more painful this pm and feeling a little "medicine head" since taking IV pain meds, but overall is mobilizing well.  Will continue to follow and practice step tomorrow.    Follow Up Recommendations  Home health PT;Supervision for mobility/OOB     Equipment Recommendations  3in1 (PT)    Recommendations for Other Services       Precautions / Restrictions Precautions Precautions: None Restrictions Weight Bearing Restrictions: Yes LLE Weight Bearing: Weight bearing as tolerated    Mobility  Bed Mobility               General bed mobility comments: pt in recliner.    Transfers Overall transfer level: Needs assistance Equipment used: Rolling walker (2 wheeled) Transfers: Sit to/from Stand Sit to Stand: Supervision         General transfer comment: cues for UE use and controlling descent to sitting.    Ambulation/Gait Ambulation/Gait assistance: Min guard Ambulation Distance (Feet): 80 Feet Assistive device: Rolling walker (2 wheeled) Gait Pattern/deviations: Step-to pattern;Decreased stride length     General Gait Details: pt indicates feeling more "medicine head" since having IV pain meds, so pt shortened ambulation distance.     Stairs            Wheelchair Mobility    Modified Rankin (Stroke Patients Only)       Balance                                    Cognition Arousal/Alertness: Awake/alert Behavior During Therapy: WFL for tasks assessed/performed Overall Cognitive Status: Within Functional Limits for tasks assessed                      Exercises      General Comments        Pertinent Vitals/Pain Pain Assessment: 0-10 Pain Score: 6  Pain  Location: L LE Pain Descriptors / Indicators: Sore Pain Intervention(s): Monitored during session;Premedicated before session;Repositioned    Home Living Family/patient expects to be discharged to:: Private residence Living Arrangements: Spouse/significant other Available Help at Discharge: Family;Available 24 hours/day Type of Home: House Home Access: Stairs to enter Entrance Stairs-Rails: None Home Layout: One level Home Equipment: Environmental consultant - 2 wheels Additional Comments: Wife is disabled, but his brothers and sisters will be providing care for pt.      Prior Function Level of Independence: Independent with assistive device(s)      Comments: pt had been using RW lately due to pain.     PT Goals (current goals can now be found in the care plan section) Acute Rehab PT Goals Patient Stated Goal: Walk without pain. PT Goal Formulation: With patient Time For Goal Achievement: 04/08/15 Potential to Achieve Goals: Good Progress towards PT goals: Progressing toward goals    Frequency  7X/week    PT Plan Current plan remains appropriate    Co-evaluation             End of Session Equipment Utilized During Treatment: Gait belt Activity Tolerance: Patient tolerated treatment well Patient left: in chair;with call bell/phone within reach     Time: 7939-0300  PT Time Calculation (min) (ACUTE ONLY): 16 min  Charges:  $Gait Training: 8-22 mins                    G CodesSunny Schlein, [place] [phone] 04/01/2015, 3:19 PM

## 2015-04-01 NOTE — Care Management (Signed)
Utilization review completed by Emanie Behan N. Marionna Gonia, RN BSN 

## 2015-04-02 MED ORDER — OXYCODONE-ACETAMINOPHEN 5-325 MG PO TABS: 1.0000 | ORAL_TABLET | ORAL | Status: AC | PRN

## 2015-04-02 MED ORDER — METHOCARBAMOL 500 MG PO TABS: 500.0000 mg | ORAL_TABLET | Freq: Four times a day (QID) | ORAL | Status: AC | PRN

## 2015-04-02 MED ORDER — ASPIRIN 325 MG PO TBEC: 325.0000 mg | DELAYED_RELEASE_TABLET | Freq: Two times a day (BID) | ORAL | Status: AC

## 2015-04-02 NOTE — Progress Notes (Signed)
Physical Therapy Treatment Patient Details Name: Bobby Reeves MRN: [id number] DOB: 10-Jun-1961 Today's Date: 04/02/2015    History of Present Illness pt presents with L direct THA.  pt with hx of Bil shoulder surgeries.      PT Comments    Patient progressing well with overall mobility. Able to complete stair training this morning. Plan is to DC later today. Patient safe to D/C from a mobility standpoint based on progression towards goals set on PT eval.    Follow Up Recommendations  Home health PT;Supervision for mobility/OOB     Equipment Recommendations  3in1 (PT)    Recommendations for Other Services       Precautions / Restrictions Precautions Precautions: None Restrictions Weight Bearing Restrictions: Yes LLE Weight Bearing: Weight bearing as tolerated    Mobility  Bed Mobility Overal bed mobility: Needs Assistance Bed Mobility: Supine to Sit     Supine to sit: Supervision     General bed mobility comments: Cues for positioning  Transfers Overall transfer level: Modified independent                  Ambulation/Gait Ambulation/Gait assistance: Supervision Ambulation Distance (Feet): 170 Feet Assistive device: Rolling walker (2 wheeled) Gait Pattern/deviations: Step-through pattern;Decreased stride length   Gait velocity interpretation: Below normal speed for age/gender General Gait Details: Patient guarded with gait due to increased L quad pain per his report. Otherwise safe use of RW and technique   Stairs Stairs: Yes Stairs assistance: Min guard Stair Management: Step to pattern;Forwards;No rails;With walker Number of Stairs: 1 General stair comments: Cues for sequence and technique  Wheelchair Mobility    Modified Rankin (Stroke Patients Only)       Balance                                    Cognition Arousal/Alertness: Awake/alert Behavior During Therapy: WFL for tasks assessed/performed Overall Cognitive  Status: Within Functional Limits for tasks assessed                      Exercises Total Joint Exercises Quad Sets: AROM;Both;10 reps Heel Slides: AAROM;Left;10 reps Long Arc Quad: AROM;Left;10 reps    General Comments        Pertinent Vitals/Pain Pain Score: 8  Pain Location: L quad Pain Descriptors / Indicators: Sore;Aching Pain Intervention(s): Monitored during session;Repositioned    Home Living                      Prior Function            PT Goals (current goals can now be found in the care plan section) Progress towards PT goals: Progressing toward goals    Frequency  7X/week    PT Plan Current plan remains appropriate    Co-evaluation             End of Session Equipment Utilized During Treatment: Gait belt Activity Tolerance: Patient tolerated treatment well Patient left: in chair;with call bell/phone within reach     Time: 0756-0821 PT Time Calculation (min) (ACUTE ONLY): 25 min  Charges:  $Gait Training: 8-22 mins $Therapeutic Exercise: 8-22 mins                    G Codes:      Fredrich Birks 04/02/2015, 9:40 AM  04/02/2015 Fredrich Birks PTA [phone] pager [phone] office

## 2015-04-02 NOTE — Progress Notes (Signed)
Occupational Therapy Treatment Patient Details Name: Bobby Reeves MRN: [id number] DOB: [birth year] Today's Date: 04/02/2015    History of present illness pt presents with L direct THA.  pt with hx of Bil shoulder surgeries.     OT comments  Pt progressing towards acute OT goals. Focus of session was on tub transfer which pt completed as detailed below. D/c plan remains appropriate.   Follow Up Recommendations  No OT follow up;Supervision - Intermittent    Equipment Recommendations  3 in 1 bedside comode    Recommendations for Other Services      Precautions / Restrictions Precautions Precautions: None Restrictions Weight Bearing Restrictions: Yes LLE Weight Bearing: Weight bearing as tolerated       Mobility Bed Mobility Overal bed mobility: Needs Assistance Bed Mobility: Supine to Sit     Supine to sit: Supervision     General bed mobility comments: in recliner  Transfers Overall transfer level: Needs assistance Equipment used: Rolling walker (2 wheeled) Transfers: Sit to/from Stand Sit to Stand: Supervision         General transfer comment: supervision for safety cues for position of rw    Balance Overall balance assessment: Needs assistance         Standing balance support: Bilateral upper extremity supported;During functional activity Standing balance-Leahy Scale: Fair                     ADL Overall ADL's : Needs assistance/impaired                         Toilet Transfer: Supervision/safety;Ambulation;RW       Tub/ Shower Transfer: Supervision/safety;Ambulation;3 in 1;Rolling walker   Functional mobility during ADLs: Supervision/safety;Rolling walker General ADL Comments: Pt completed transfer to 3n1 ambulating in-room to simulate home setup for toilet and shower transfer. Educated on shower transfer safety, LB ADLs, and safety with home setup.      Vision                     Perception     Praxis       Cognition   Behavior During Therapy: WFL for tasks assessed/performed Overall Cognitive Status: Within Functional Limits for tasks assessed                       Extremity/Trunk Assessment               Exercises   Shoulder Instructions       General Comments      Pertinent Vitals/ Pain       Pain Assessment: 0-10 Pain Score: 6  Pain Location: L quad Pain Descriptors / Indicators: Aching;Sore Pain Intervention(s): Limited activity within patient's tolerance;Monitored during session;Repositioned  Home Living                                          Prior Functioning/Environment              Frequency Min 2X/week     Progress Toward Goals  OT Goals(current goals can now be found in the care plan section)  Progress towards OT goals: Progressing toward goals  Acute Rehab OT Goals Patient Stated Goal: Walk without pain. OT Goal Formulation: With patient Time For Goal Achievement: 04/08/15 Potential to Achieve Goals: Good ADL Goals Pt Will Perform  Lower Body Dressing: with set-up;with supervision;sit to/from stand Pt Will Transfer to Toilet: with modified independence;ambulating Pt Will Perform Tub/Shower Transfer: Tub transfer;with supervision;ambulating;3 in 1;rolling walker  Plan Discharge plan remains appropriate    Co-evaluation                 End of Session Equipment Utilized During Treatment: Gait belt;Rolling walker   Activity Tolerance Patient tolerated treatment well   Patient Left in chair;with call bell/phone within reach   Nurse Communication          Time: 1610-9604 OT Time Calculation (min): 14 min  Charges: OT General Charges $OT Visit: 1 Procedure OT Treatments $Self Care/Home Management : 8-22 mins  Pilar Grammes 04/02/2015, 11:17 AM

## 2015-04-02 NOTE — Discharge Summary (Signed)
Patient ID: Bobby Reeves MRN: [id number] DOB/AGE: 02-20-1961 54 y.o.  Admit date: 03/31/2015 Discharge date: 04/02/2015  Admission Diagnoses:  Principal Problem:   Primary osteoarthritis of left hip   Discharge Diagnoses:  Same  Past Medical History  Diagnosis Date  . 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 , followed up with Dr. Franky Macho  . Neuromuscular disorder   . Fibromyalgia     Surgeries: Procedure(s): TOTAL HIP ARTHROPLASTY ANTERIOR APPROACH on 03/31/2015   Consultants:    Discharged Condition: Improved  Hospital Course: Bobby Reeves is an 54 y.o. male who was admitted 03/31/2015 for operative treatment ofPrimary osteoarthritis of left hip. Patient has severe unremitting pain that affects sleep, daily activities, and work/hobbies. After pre-op clearance the patient was taken to the operating room on 03/31/2015 and underwent  Procedure(s): TOTAL HIP ARTHROPLASTY ANTERIOR APPROACH.    Patient was given perioperative antibiotics: Anti-infectives    Start     Dose/Rate Route Frequency Ordered Stop   03/31/15 1730  ceFAZolin (ANCEF) IVPB 2 g/50 mL premix     2 g 100 mL/hr over 30 Minutes Intravenous Every 6 hours 03/31/15 1645 04/01/15 0115   03/31/15 1026  ceFAZolin (ANCEF) 2-3 GM-% IVPB SOLR    Comments:  Rogelia Mire   : cabinet override      03/31/15 1026 03/31/15 1118   03/31/15 1022  ceFAZolin (ANCEF) IVPB 2 g/50 mL premix  Status:  Discontinued     2 g 100 mL/hr over 30 Minutes Intravenous On call to O.R. 03/31/15 1022 03/31/15 1607       Patient was given sequential compression devices, early ambulation, and chemoprophylaxis to prevent DVT.  Patient benefited maximally from hospital stay and there were no complications.    Recent vital signs: Patient Vitals for the past 24 hrs:  BP Temp Pulse Resp SpO2  04/02/15 0700 - 99.5 F (37.5 C) - - -  04/02/15 0546 122/77 mmHg 100 F (37.8 C) 100 16 95 %  04/01/15 1932  123/71 mmHg 99.7 F (37.6 C) (!) 107 16 95 %  04/01/15 1300 (!) 153/106 mmHg 98.4 F (36.9 C) (!) 108 16 96 %     Recent laboratory studies: No results for input(s): WBC, HGB, HCT, PLT, NA, K, CL, CO2, BUN, CREATININE, GLUCOSE, INR, CALCIUM in the last 72 hours.  Invalid input(s): PT, 2   Discharge Medications:     Medication List    STOP taking these medications        HYDROcodone-acetaminophen 7.5-325 MG per tablet  Commonly known as:  NORCO     ibuprofen 800 MG tablet  Commonly known as:  ADVIL,MOTRIN      TAKE these medications        aspirin 325 MG EC tablet  Take 1 tablet (325 mg total) by mouth 2 (two) times daily after a meal.     gabapentin 300 MG capsule  Commonly known as:  NEURONTIN  Take 300 mg by mouth 3 (three) times daily as needed (Pain).     methocarbamol 500 MG tablet  Commonly known as:  ROBAXIN  Take 1 tablet (500 mg total) by mouth every 6 (six) hours as needed for muscle spasms.     oxyCODONE-acetaminophen 5-325 MG per tablet  Commonly known as:  ROXICET  Take 1-2 tablets by mouth every 4 (four) hours as needed for moderate pain or severe pain.        Diagnostic Studies: Dg Chest  2 View  03/23/2015   CLINICAL DATA:  Preoperative radiograph. LEFT total hip arthroplasty.  EXAM: CHEST  2 VIEW  COMPARISON:  None.  FINDINGS: Cardiopericardial silhouette within normal limits. Mediastinal contours normal. Trachea midline. No airspace disease or effusion.  IMPRESSION: No active cardiopulmonary disease.   Electronically Signed   By: Andreas Newport M.D.   On: 03/23/2015 16:17   Dg Hip Operative Unilat With Pelvis Left  03/31/2015   CLINICAL DATA:  Left hip osteoarthritis. Status post left anterior hip replacement.  EXAM: OPERATIVE LEFT HIP (WITH PELVIS IF PERFORMED) 2 VIEWS  TECHNIQUE: Fluoroscopic spot image(s) were submitted for interpretation post-operatively.  COMPARISON:  None.  FINDINGS: A bipolar left hip prosthesis is seen in expected position. No  evidence of fracture or dislocation.  IMPRESSION: Expected postoperative appearance of left hip prosthesis. No complication visualized.   Electronically Signed   By: Myles Rosenthal M.D.   On: 03/31/2015 13:01    Disposition: Final discharge disposition not confirmed      Discharge Instructions    Call MD / Call 911    Complete by:  As directed   If you experience chest pain or shortness of breath, CALL 911 and be transported to the hospital emergency room.  If you develope a fever above 101 F, pus (white drainage) or increased drainage or redness at the wound, or calf pain, call your surgeon's office.     Constipation Prevention    Complete by:  As directed   Drink plenty of fluids.  Prune juice may be helpful.  You may use a stool softener, such as Colace (over the counter) 100 mg twice a day.  Use MiraLax (over the counter) for constipation as needed.     Diet - low sodium heart healthy    Complete by:  As directed      Discharge instructions    Complete by:  As directed   INSTRUCTIONS AFTER JOINT REPLACEMENT   Remove items at home which could result in a fall. This includes throw rugs or furniture in walking pathways ICE to the affected joint every three hours while awake for 30 minutes at a time, for at least the first 3-5 days, and then as needed for pain and swelling.  Continue to use ice for pain and swelling. You may notice swelling that will progress down to the foot and ankle.  This is normal after surgery.  Elevate your leg when you are not up walking on it.   Continue to use the breathing machine you got in the hospital (incentive spirometer) which will help keep your temperature down.  It is common for your temperature to cycle up and down following surgery, especially at night when you are not up moving around and exerting yourself.  The breathing machine keeps your lungs expanded and your temperature down.   DIET:  As you were doing prior to hospitalization, we recommend a  well-balanced diet.  DRESSING / WOUND CARE / SHOWERING  You may shower 3 days after surgery, but keep the wounds dry during showering.  You may use an occlusive plastic wrap (Press'n Seal for example), NO SOAKING/SUBMERGING IN THE BATHTUB.  If the bandage gets wet, change with a clean dry gauze.  If the incision gets wet, pat the wound dry with a clean towel.  ACTIVITY  Increase activity slowly as tolerated, but follow the weight bearing instructions below.   No driving for 6 weeks or until further direction given by your physician.  You cannot drive while taking narcotics.  No lifting or carrying greater than 10 lbs. until further directed by your surgeon. Avoid periods of inactivity such as sitting longer than an hour when not asleep. This helps prevent blood clots.  You may return to work once you are authorized by your doctor.     WEIGHT BEARING   Weight bearing as tolerated with assist device (walker, cane, etc) as directed, use it as long as suggested by your surgeon or therapist, typically at least 4-6 weeks.   EXERCISES  Results after joint replacement surgery are often greatly improved when you follow the exercise, range of motion and muscle strengthening exercises prescribed by your doctor. Safety measures are also important to protect the joint from further injury. Any time any of these exercises cause you to have increased pain or swelling, decrease what you are doing until you are comfortable again and then slowly increase them. If you have problems or questions, call your caregiver or physical therapist for advice.   Rehabilitation is important following a joint replacement. After just a few days of immobilization, the muscles of the leg can become weakened and shrink (atrophy).  These exercises are designed to build up the tone and strength of the thigh and leg muscles and to improve motion. Often times heat used for twenty to thirty minutes before working out will loosen up  your tissues and help with improving the range of motion but do not use heat for the first two weeks following surgery (sometimes heat can increase post-operative swelling).   These exercises can be done on a training (exercise) mat, on the floor, on a table or on a bed. Use whatever works the best and is most comfortable for you.    Use music or television while you are exercising so that the exercises are a pleasant break in your day. This will make your life better with the exercises acting as a break in your routine that you can look forward to.   Perform all exercises about fifteen times, three times per day or as directed.  You should exercise both the operative leg and the other leg as well.   Exercises include:   Quad Sets - Tighten up the muscle on the front of the thigh (Quad) and hold for 5-10 seconds.   Straight Leg Raises - With your knee straight (if you were given a brace, keep it on), lift the leg to 60 degrees, hold for 3 seconds, and slowly lower the leg.  Perform this exercise against resistance later as your leg gets stronger.  Leg Slides: Lying on your back, slowly slide your foot toward your buttocks, bending your knee up off the floor (only go as far as is comfortable). Then slowly slide your foot back down until your leg is flat on the floor again.  Angel Wings: Lying on your back spread your legs to the side as far apart as you can without causing discomfort.  Hamstring Strength:  Lying on your back, push your heel against the floor with your leg straight by tightening up the muscles of your buttocks.  Repeat, but this time bend your knee to a comfortable angle, and push your heel against the floor.  You may put a pillow under the heel to make it more comfortable if necessary.   A rehabilitation program following joint replacement surgery can speed recovery and prevent re-injury in the future due to weakened muscles. Contact your doctor or a physical therapist for  more  information on knee rehabilitation.    CONSTIPATION  Constipation is defined medically as fewer than three stools per week and severe constipation as less than one stool per week.  Even if you have a regular bowel pattern at home, your normal regimen is likely to be disrupted due to multiple reasons following surgery.  Combination of anesthesia, postoperative narcotics, change in appetite and fluid intake all can affect your bowels.   YOU MUST use at least one of the following options; they are listed in order of increasing strength to get the job done.  They are all available over the counter, and you may need to use some, POSSIBLY even all of these options:    Drink plenty of fluids (prune juice may be helpful) and high fiber foods Colace 100 mg by mouth twice a day  Senokot for constipation as directed and as needed Dulcolax (bisacodyl), take with full glass of water  Miralax (polyethylene glycol) once or twice a day as needed.  If you have tried all these things and are unable to have a bowel movement in the first 3-4 days after surgery call either your surgeon or your primary doctor.    If you experience loose stools or diarrhea, hold the medications until you stool forms back up.  If your symptoms do not get better within 1 week or if they get worse, check with your doctor.  If you experience "the worst abdominal pain ever" or develop nausea or vomiting, please contact the office immediately for further recommendations for treatment.   ITCHING:  If you experience itching with your medications, try taking only a single pain pill, or even half a pain pill at a time.  You can also use Benadryl over the counter for itching or also to help with sleep.   TED HOSE STOCKINGS:  Use stockings on both legs until for at least 2 weeks or as directed by physician office. They may be removed at night for sleeping.  MEDICATIONS:  See your medication summary on the "After Visit Summary" that nursing will  review with you.  You may have some home medications which will be placed on hold until you complete the course of blood thinner medication.  It is important for you to complete the blood thinner medication as prescribed.  PRECAUTIONS:  If you experience chest pain or shortness of breath - call 911 immediately for transfer to the hospital emergency department.   If you develop a fever greater that 101 F, purulent drainage from wound, increased redness or drainage from wound, foul odor from the wound/dressing, or calf pain - CONTACT YOUR SURGEON.                                                   FOLLOW-UP APPOINTMENTS:  If you do not already have a post-op appointment, please call the office for an appointment to be seen by your surgeon.  Guidelines for how soon to be seen are listed in your "After Visit Summary", but are typically between 1-4 weeks after surgery.  OTHER INSTRUCTIONS:   Knee Replacement:  Do not place pillow under knee, focus on keeping the knee straight while resting. CPM instructions: 0-90 degrees, 2 hours in the morning, 2 hours in the afternoon, and 2 hours in the evening. Place foam block, curve side up under heel  at all times except when in CPM or when walking.  DO NOT modify, tear, cut, or change the foam block in any way.  MAKE SURE YOU:  Understand these instructions.  Get help right away if you are not doing well or get worse.    Thank you for letting us be a part of your medical care team.  It is a privilege we respect greatly.  We hope these instructions will help you stay on track for a fast and full recovery!     Increase activity slowly as tolerated    Complete by:  As directed            Follow-up Information    Follow up with Velna Ochs, MD. Schedule an appointment as soon as possible for a visit in 2 weeks.   Specialty:  Orthopedic Surgery   Contact information:   [address] [place] [address] [phone]       Follow up with [facility].   Why:  They will contact you to schedule home therapy visits.   Contact information:   [address] [place] [address] [phone]        Signed: Drema Halon 04/02/2015, 9:10 AM

## 2015-12-31 IMAGING — CR DG CHEST 2V
2 series · 2 of 2 positions shown · non-contrast
Comparison: None.

CLINICAL DATA: Preoperative radiograph. LEFT total hip
arthroplasty.

EXAM:
CHEST  2 VIEW

[w chest pa]
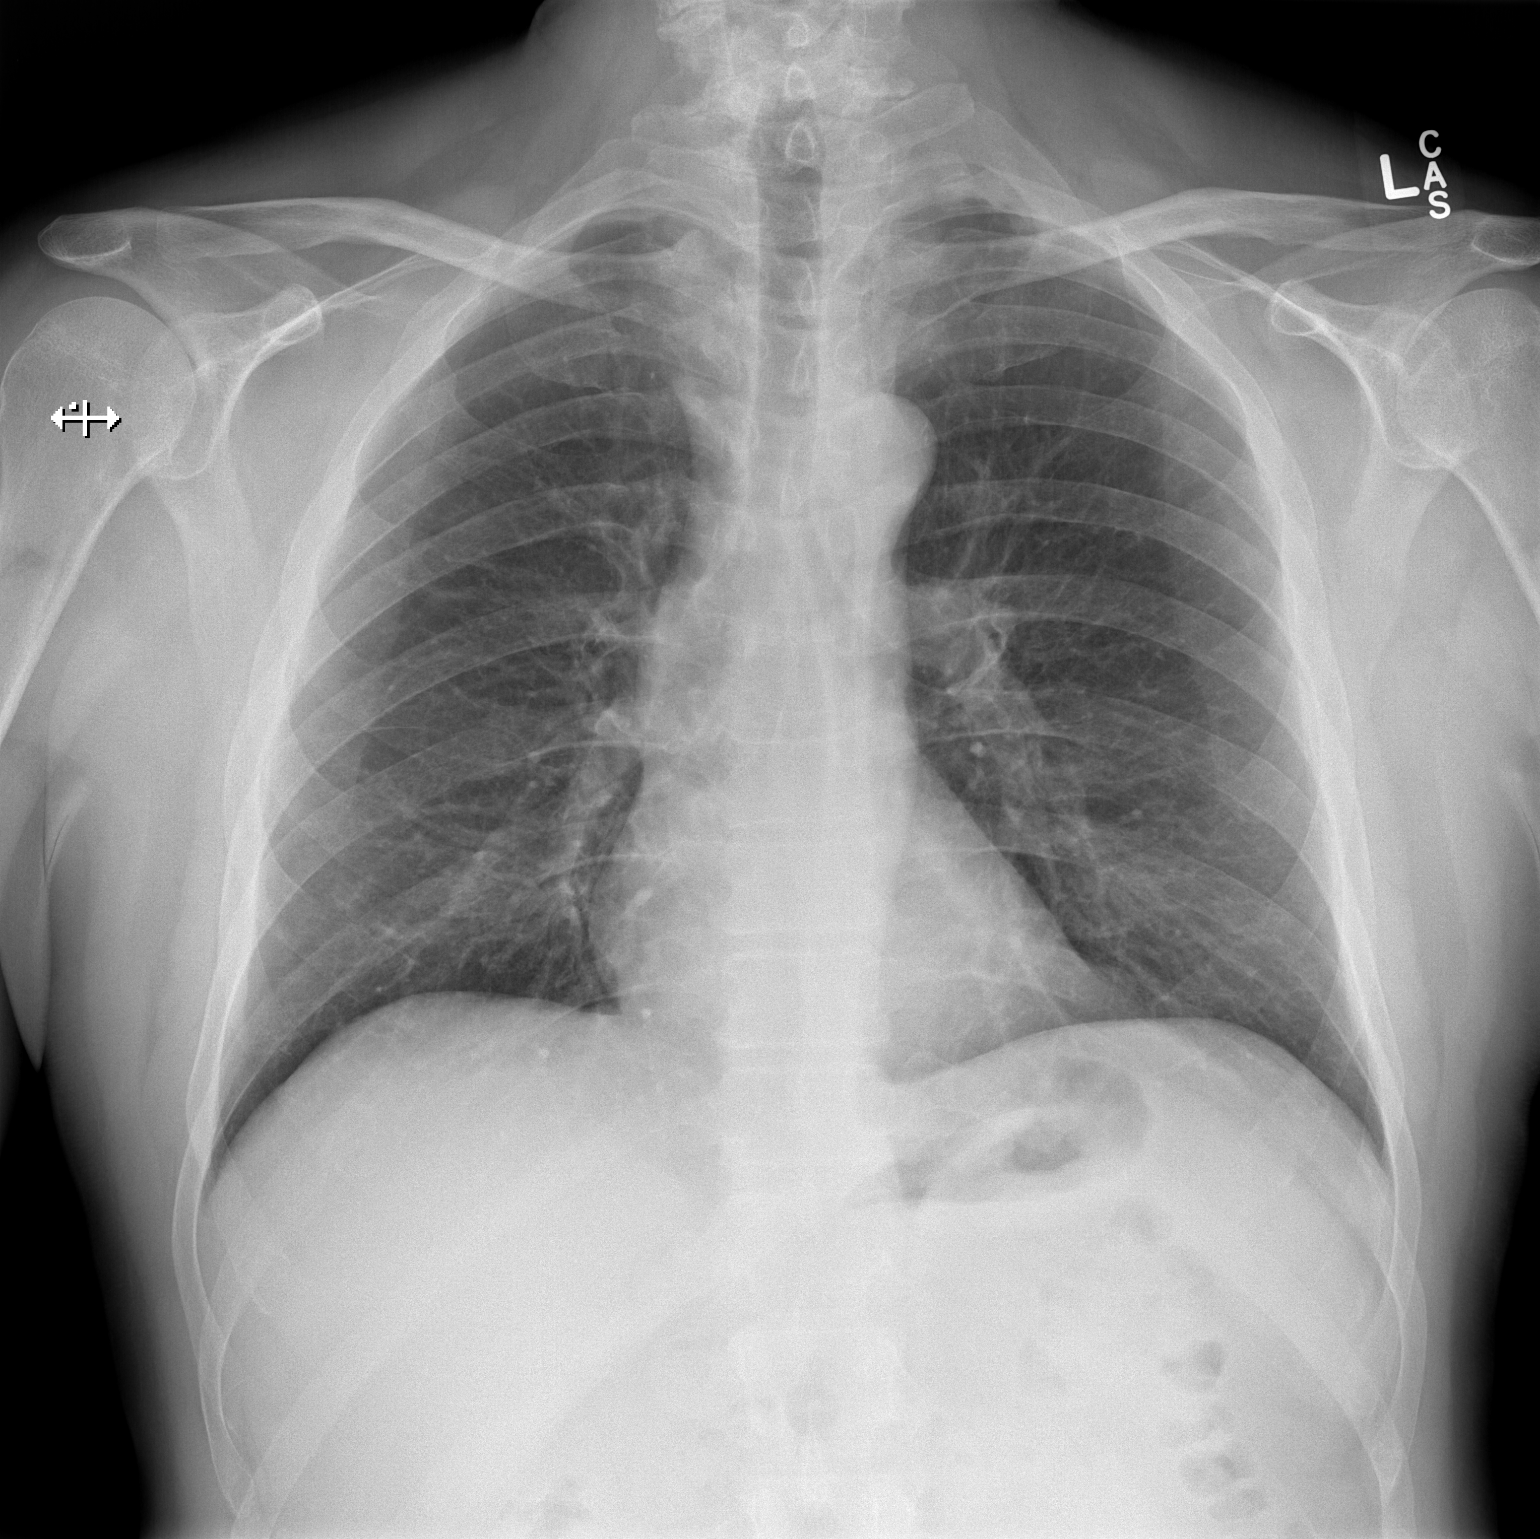

[w chest lat]
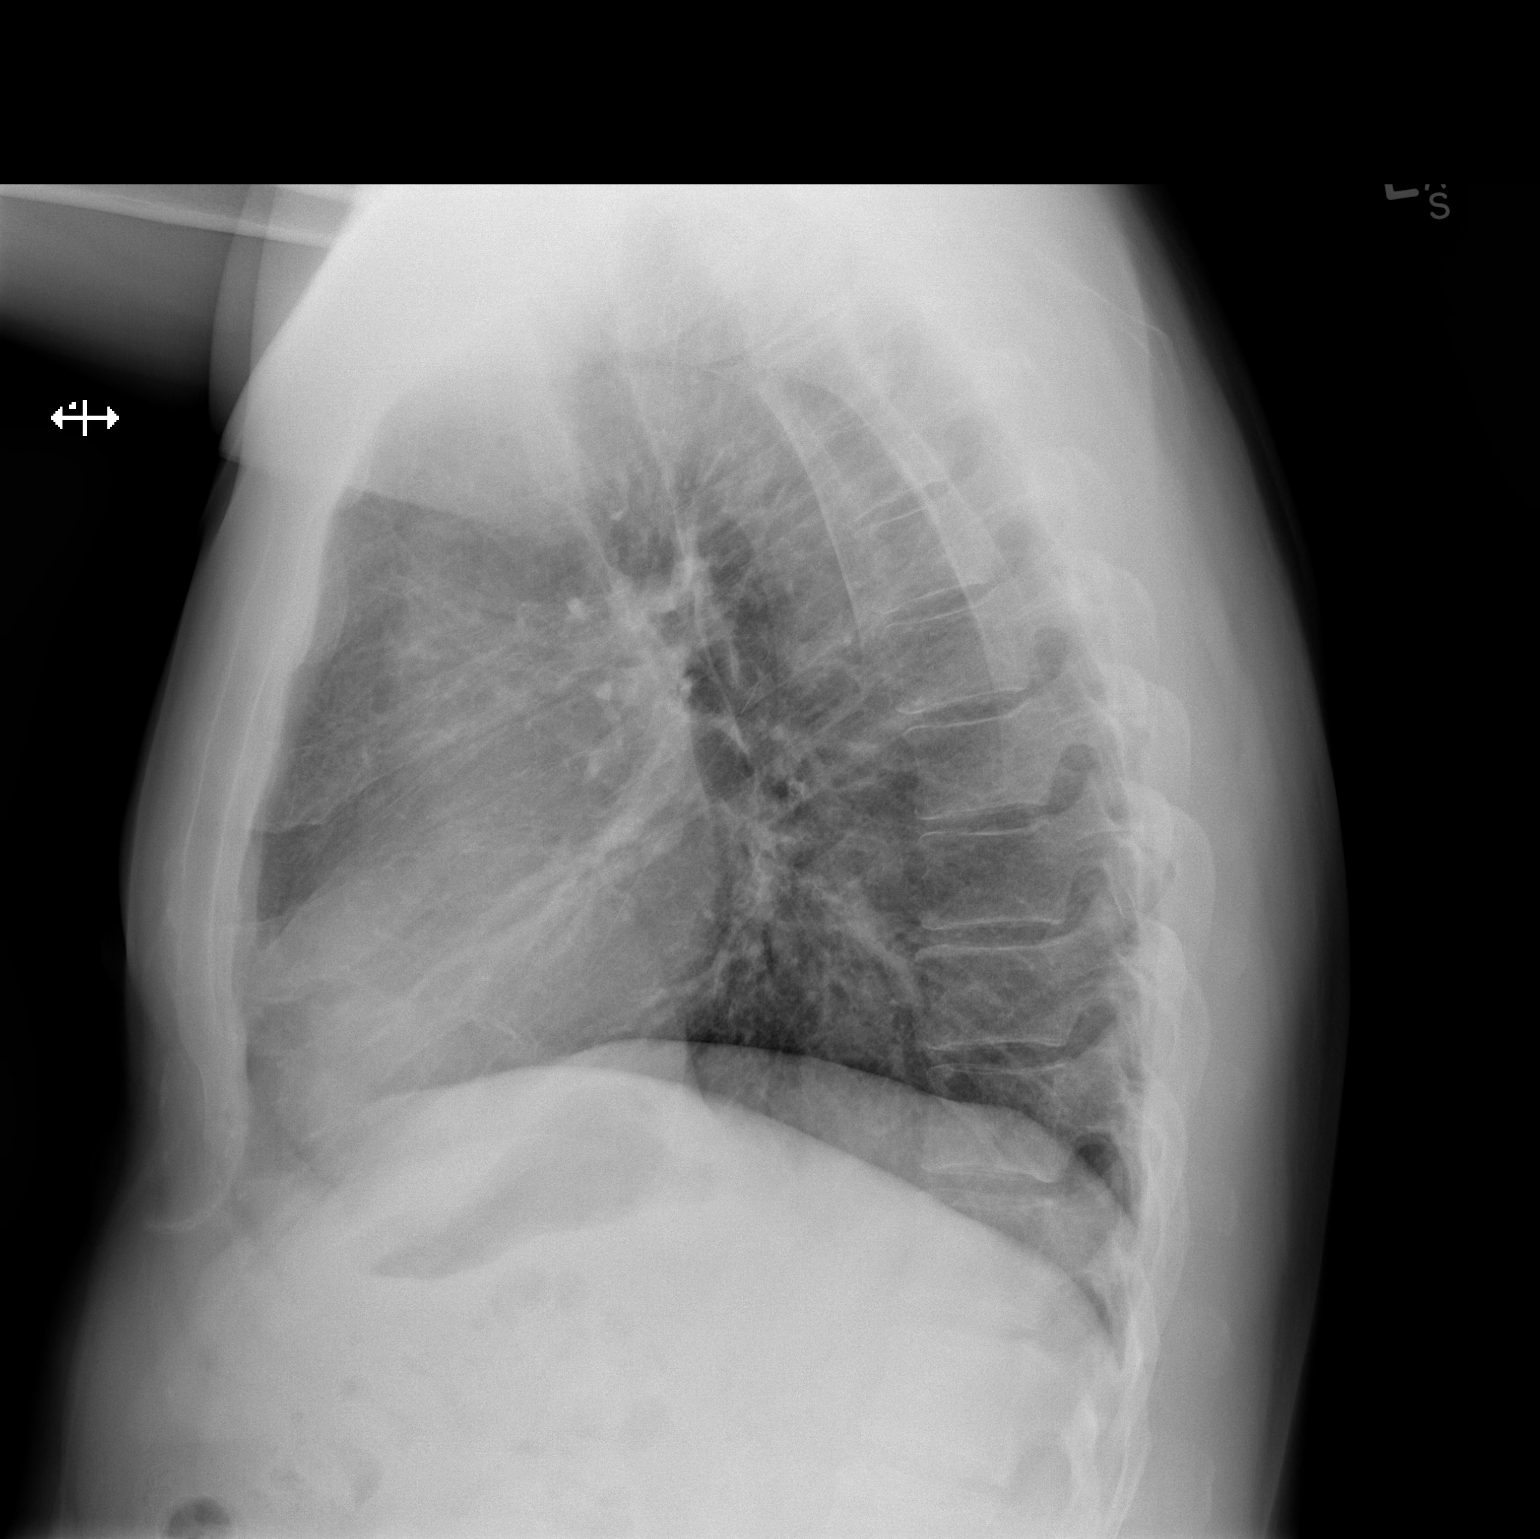

[2 of 2 positions shown; findings below may reference images not displayed]

FINDINGS: Cardiopericardial silhouette within normal limits. Mediastinal
contours normal. Trachea midline. No airspace disease or effusion.
IMPRESSION: No active cardiopulmonary disease.
